# Patient Record
Sex: Female | Born: 1995 | Hispanic: Refuse to answer | Marital: Single | Smoking: Never smoker
Health system: Southern US, Community
[De-identification: ages and names within clinical notes are randomized; demographics above are authoritative.]

---
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
## 2017-07-22 ENCOUNTER — Encounter: Payer: Self-pay | Admitting: Family Medicine

## 2017-07-22 ENCOUNTER — Ambulatory Visit (INDEPENDENT_AMBULATORY_CARE_PROVIDER_SITE_OTHER): Payer: BLUE CROSS/BLUE SHIELD | Admitting: Family Medicine

## 2017-07-22 VITALS — BP 132/88 | HR 83 | Temp 98.7°F | Resp 14

## 2017-07-22 DIAGNOSIS — R197 Diarrhea, unspecified: Secondary | ICD-10-CM

## 2017-07-22 NOTE — Progress Notes (Signed)
Patient states that she went to [place] at the beginning of January. At the start of her trip she did have vomiting and diarrhea. This lasted for 1-2 days. She admits that after that she had no problems on her trip. There were other people on the trip that did get sick. When patient returned in late January she had a week of symptoms of diarrhea. She had no vomiting. Her symptoms did improve and then she got constipated. She also admits that she got a hemorrhoid which has now improved. The constipation then resolved after a few days. She now is asymptomatic. She has no abdominal pain, fever, myalgias, headache, nausea, vomiting, diarrhea, urinary symptoms. She still however is worried about possibly having a parasite as other people on the trip did.  ROS: Negative except mentioned above. Vitals as per Epic.  GENERAL: NAD HEENT: no pharyngeal erythema, no exudate, no erythema of TMs, no cervical LAD RESP: CTA B CARD: RRR ABD: Positive bowel sounds, nontender, *does not want to show me a hemorrhoid NEURO: CN II-XII grossly intact   A/P: Diarrhea -given her recent travel and symptoms well do stool studies. Patient is to seek medical attention if any symptoms start again. Should follow up with me as well if the hemorrhoid persists or worsens. Discussed avoiding constipation with high fiber diet and increased water. Avoid lifting in the weight room for now until hemorrhoid has resolved.

## 2017-07-23 ENCOUNTER — Other Ambulatory Visit: Payer: Self-pay | Admitting: Family Medicine

## 2017-07-28 LAB — CLOSTRIDIUM DIFFICILE EIA: C DIFFICILE TOXINS A+ B, EIA: NEGATIVE

## 2017-07-28 LAB — STOOL CULTURE: E COLI SHIGA TOXIN ASSAY: NEGATIVE

## 2017-07-28 LAB — OVA AND PARASITE EXAMINATION

## 2017-08-16 ENCOUNTER — Encounter: Payer: Self-pay | Admitting: Family Medicine

## 2017-08-16 ENCOUNTER — Ambulatory Visit (INDEPENDENT_AMBULATORY_CARE_PROVIDER_SITE_OTHER): Payer: BLUE CROSS/BLUE SHIELD | Admitting: Family Medicine

## 2017-08-16 VITALS — BP 124/73 | HR 55 | Temp 99.0°F | Resp 14

## 2017-08-16 DIAGNOSIS — R05 Cough: Secondary | ICD-10-CM

## 2017-08-16 DIAGNOSIS — J029 Acute pharyngitis, unspecified: Secondary | ICD-10-CM

## 2017-08-16 DIAGNOSIS — R059 Cough, unspecified: Secondary | ICD-10-CM

## 2017-08-16 LAB — POCT INFLUENZA A/B
INFLUENZA A, POC: NEGATIVE
INFLUENZA B, POC: NEGATIVE

## 2017-08-16 LAB — POCT RAPID STREP A (OFFICE): RAPID STREP A SCREEN: NEGATIVE

## 2017-08-16 NOTE — Progress Notes (Signed)
Patient presents today with symptoms of mild cough, sore throat, night sweats, headache for the last 2 days. She denies any muscle aches, chest pain, shortness of breath, severe headache, diarrhea, vomiting. She admits to some fatigue over the last 2 days. She has not taken any medications today. Patient states her last menstrual period was a few days ago.  ROS: Negative except mentioned above.  GENERAL: NAD HEENT: mild pharyngeal erythema, no exudate, no erythema of TMs, no cervical LAD RESP: CTA B CARD: RRR NEURO: CN II-XII grossly intact   A/P: URI - likely viral, flu screen and rapid strep test were negative in the office, will do CBC and mono spot test, rest, hydration, Tylenol/Ibuprofen when necessary, Delsym when necessary, Claritin when necessary, no athletic activity her class if febrile, will discuss any further treatment plan once lab results have been reviewed.

## 2017-08-17 LAB — CBC WITH DIFFERENTIAL/PLATELET
Basophils Absolute: 0 10*3/uL (ref 0.0–0.2)
Basos: 0 %
EOS (ABSOLUTE): 0.1 10*3/uL (ref 0.0–0.4)
EOS: 1 %
HEMATOCRIT: 46.8 % — AB (ref 34.0–46.6)
Hemoglobin: 16 g/dL — ABNORMAL HIGH (ref 11.1–15.9)
Immature Grans (Abs): 0 10*3/uL (ref 0.0–0.1)
Immature Granulocytes: 0 %
LYMPHS ABS: 1.6 10*3/uL (ref 0.7–3.1)
Lymphs: 17 %
MCH: 31.7 pg (ref 26.6–33.0)
MCHC: 34.2 g/dL (ref 31.5–35.7)
MCV: 93 fL (ref 79–97)
MONOS ABS: 0.7 10*3/uL (ref 0.1–0.9)
Monocytes: 8 %
Neutrophils Absolute: 7 10*3/uL (ref 1.4–7.0)
Neutrophils: 74 %
Platelets: 263 10*3/uL (ref 150–379)
RBC: 5.04 x10E6/uL (ref 3.77–5.28)
RDW: 12.6 % (ref 12.3–15.4)
WBC: 9.4 10*3/uL (ref 3.4–10.8)

## 2017-08-17 LAB — MONONUCLEOSIS SCREEN: Mono Screen: NEGATIVE

## 2018-02-06 ENCOUNTER — Emergency Department
Admission: EM | Admit: 2018-02-06 | Discharge: 2018-02-06 | Disposition: A | Discharge status: Home / Self Care | Payer: BLUE CROSS/BLUE SHIELD | Attending: Emergency Medicine | Admitting: Emergency Medicine

## 2018-02-06 ENCOUNTER — Other Ambulatory Visit: Payer: Self-pay

## 2018-02-06 DIAGNOSIS — Z79899 Other long term (current) drug therapy: Secondary | ICD-10-CM | POA: Diagnosis not present

## 2018-02-06 DIAGNOSIS — R112 Nausea with vomiting, unspecified: Secondary | ICD-10-CM | POA: Insufficient documentation

## 2018-02-06 DIAGNOSIS — R531 Weakness: Secondary | ICD-10-CM | POA: Diagnosis not present

## 2018-02-06 DIAGNOSIS — R197 Diarrhea, unspecified: Secondary | ICD-10-CM | POA: Diagnosis not present

## 2018-02-06 MED ORDER — ONDANSETRON HCL 4 MG/2ML IJ SOLN: 4.0000 mg | Freq: Once | INTRAMUSCULAR | Status: DC | PRN

## 2018-02-06 NOTE — ED Provider Notes (Signed)
[facility] Emergency Department Provider Note   ____________________________________________   First MD Initiated Contact with Patient 02/06/18 [id number]     (approximate)  I have reviewed the triage vital signs and the nursing notes.   HISTORY  Chief Complaint Emesis and Diarrhea   HPI Cheryl Mcclain is a 22 y.o. female who complains of nausea vomiting diarrhea.  Last vomiting was at midnight in the last diarrhea was at 2 AM.  Patient is tolerating p.o. fluids well.  She still feels kind of weak.  I encouraged her to continue drinking more p.o. fluids and see if that will work.  She is not running a fever and otherwise feels well.  He has no abdominal pain or cramps.   History reviewed. No pertinent past medical history.  There are no active problems to display for this patient.   History reviewed. No pertinent surgical history.  Prior to Admission medications   Medication Sig Start Date End Date Taking? Authorizing Provider  norethindrone-ethinyl estradiol-iron (ESTROSTEP FE,TILIA FE,TRI-LEGEST FE) 1-20/1-30/1-35 MG-MCG tablet Take 1 tablet by mouth daily.   Yes [provider]    Allergies Patient has no known allergies.  No family history on file.  Social History Social History   Tobacco Use  . Smoking status: Never Smoker  . Smokeless tobacco: Never Used  Substance Use Topics  . Alcohol use: Yes  . Drug use: Not on file    Review of Systems  Constitutional: No fever/chills Eyes: No visual changes. ENT: No sore throat. Cardiovascular: Denies chest pain. Respiratory: Denies shortness of breath. Gastrointestinal: See HPI Genitourinary: Negative for dysuria. Musculoskeletal: Negative for back pain. Skin: Negative for rash. Neurological: Negative for headaches, focal weakness  ____________________________________________   PHYSICAL EXAM:  VITAL SIGNS: ED Triage Vitals  Enc Vitals Group     BP 02/06/18 0922 122/88    Pulse Rate 02/06/18 0922 93     Resp 02/06/18 0922 14     Temp 02/06/18 0922 98.3 F (36.8 C)     Temp Source 02/06/18 0922 Oral     SpO2 02/06/18 0922 99 %     Weight 02/06/18 0913 140 lb (63.5 kg)     Height 02/06/18 0913 5\' 4"  (1.626 m)     Head Circumference --      Peak Flow --      Pain Score 02/06/18 0913 0     Pain Loc --      Pain Edu? --      Excl. in GC? --    Constitutional: Alert and oriented. Well appearing and in no acute distress. Eyes: Conjunctivae are normal. Head: Atraumatic. Nose: No congestion/rhinnorhea. Mouth/Throat: Mucous membranes are moist.  Oropharynx non-erythematous. Neck: No stridor.  Cardiovascular: Normal rate, regular rhythm. Grossly normal heart sounds.  Good peripheral circulation. Respiratory: Normal respiratory effort.  No retractions. Lungs CTAB. Gastrointestinal: Soft and nontender. No distention. No abdominal bruits. No CVA tenderness. Musculoskeletal: No lower extremity tenderness nor edema.  No joint effusions. Neurologic:  Normal speech and language. No gross focal neurologic deficits are appreciated. No gait instability. Skin:  Skin is warm, dry and intact. No rash noted. Psychiatric: Mood and affect are normal. Speech and behavior are normal.  ____________________________________________   LABS (all labs ordered are listed, but only abnormal results are displayed)  Labs Reviewed - No data to display ____________________________________________  EKG   ____________________________________________  RADIOLOGY  ED MD interpretation:   Official radiology report(s): No results found.  ____________________________________________  PROCEDURES  Procedure(s) performed:   Procedures  Critical Care performed:   ____________________________________________   INITIAL IMPRESSION / ASSESSMENT AND PLAN / ED COURSE  Patient does well.  She leaves as planned but without discharge instructions as I was in the middle of the  code when she felt better and could not wait.         ____________________________________________   FINAL CLINICAL IMPRESSION(S) / ED DIAGNOSES  Final diagnoses:  Nausea vomiting and diarrhea     ED Discharge Orders    None       Note:  This document was prepared using Dragon voice recognition software and may include unintentional dictation errors.    Arnaldo Natal, MD 02/06/18 1248

## 2018-02-06 NOTE — ED Notes (Signed)
Patient tolerating liquids and crackers well

## 2018-02-06 NOTE — ED Notes (Signed)
Dr Darnelle Catalan states  He told patient the verbal instructions  pt left without  Paper instructions

## 2018-02-06 NOTE — ED Notes (Signed)
Patient  Not in exam room  Gown on stretcher Dr Darnelle Catalan

## 2018-02-06 NOTE — ED Triage Notes (Signed)
Pt c/o N/V/D since yesterday afternoon, after eating [employer]. Pt states "I think I have food poisoning" . Pt is not actively vomiting in triage.

## 2018-02-14 ENCOUNTER — Other Ambulatory Visit: Payer: Self-pay | Admitting: Family Medicine

## 2018-02-14 DIAGNOSIS — M545 Low back pain, unspecified: Secondary | ICD-10-CM

## 2018-02-15 ENCOUNTER — Ambulatory Visit
Admission: RE | Admit: 2018-02-15 | Discharge: 2018-02-15 | Disposition: A | Discharge status: Home / Self Care | Payer: BLUE CROSS/BLUE SHIELD | Source: Ambulatory Visit | Attending: Family Medicine | Admitting: Family Medicine

## 2018-02-15 DIAGNOSIS — M545 Low back pain, unspecified: Secondary | ICD-10-CM

## 2018-02-17 ENCOUNTER — Encounter: Payer: Self-pay | Admitting: Family Medicine

## 2018-02-17 ENCOUNTER — Ambulatory Visit (INDEPENDENT_AMBULATORY_CARE_PROVIDER_SITE_OTHER): Payer: PRIVATE HEALTH INSURANCE | Admitting: Family Medicine

## 2018-02-17 DIAGNOSIS — M545 Low back pain, unspecified: Secondary | ICD-10-CM

## 2018-02-17 MED ORDER — NAPROXEN 500 MG PO TABS: 500.0000 mg | ORAL_TABLET | Freq: Two times a day (BID) | ORAL | 0 refills | Status: AC

## 2018-02-22 ENCOUNTER — Ambulatory Visit
Admission: RE | Admit: 2018-02-22 | Discharge: 2018-02-22 | Disposition: A | Discharge status: Home / Self Care | Payer: BLUE CROSS/BLUE SHIELD | Source: Ambulatory Visit | Attending: Family Medicine | Admitting: Family Medicine

## 2018-02-22 DIAGNOSIS — M545 Low back pain, unspecified: Secondary | ICD-10-CM

## 2018-02-24 ENCOUNTER — Ambulatory Visit (INDEPENDENT_AMBULATORY_CARE_PROVIDER_SITE_OTHER): Payer: PRIVATE HEALTH INSURANCE | Admitting: Family Medicine

## 2018-02-24 DIAGNOSIS — M545 Low back pain, unspecified: Secondary | ICD-10-CM

## 2018-03-03 NOTE — Progress Notes (Signed)
Presents today with symptoms of right lower back pain.  She describes her pain initially starting off with hitting golf balls. She has been told that at the end of her swing she is going into more extension of her back on the right side than she needs to. She now is experiencing pain when she arches her back outside of playing golf.  Some discomfort with positions with sleeping.  She denies any fever, weight loss, incontinence, foot drop, radicular symptoms. Patient states that she had this pain over the summer and went to see an orthopedic physician in [place].  She was told that she had problems with her facet joint and got injections in the area that she was having pain.  She denies getting any imaging such as x-ray, CT, MRI at that time.  She does not feel that the injections helped much but the physician said that she may need further injections.  Patient has not been taking any medications on a consistent basis for her pain since the injections.  Patient denies any history of stress injury or vitamin D deficiency.  ROS: Negative except mentioned above. Vitals as per Epic.  GENERAL: NAD MSK: No midline tenderness, mild paravertebral tenderness on the right lower lumbar area, mild discomfort with extension and right side bending, negative straight leg raise, 5 out of 5 strength of lower extremities, toe and heel walk, N/V intact NEURO: CN II-XII grossly intact   A/P: Right lower back pain: x-rays were done earlier today which do not show any signs of spondy., given her symptoms and history would recommend further imaging to evaluate this with MRI, I have asked that she avoid any activities that cause her pain including hitting golf balls, would recommend that patient see PT and discussed with her coach about changing her form with hitting, can you with putting if no discomfort, NSAIDs prescribed, seek medical attention if any acute worsening symptoms, plan above was discussed with athletic  trainer.  Will have patient see Dr. Ardine Eng after MRI has been done.

## 2018-03-04 NOTE — Progress Notes (Signed)
Patient presents today to discuss recent MRI.  Patient states that her back symptoms have improved some since her last visit.  She no longer has symptoms with her daily activities however does feel some discomfort when she lays in a particular position when sleeping.  She is not played golf since we last met.  She is planning to see PT soon.  Denies any radicular symptoms, incontinence, fever, weight loss, foot drop.  ROS: Negative except mentioned above. Vitals as per Epic.  GENERAL: NAD RESP: CTA B CARD: RRR MSK: Back - No midline tenderness, mild right lower lumbar paravertebral tenderness, normal flexion, mild discomfort with extension and right side bending, mildly tight hamstrings, 5 out of 5 strength of lower extremities, negative straight leg raise, N/V intact NEURO: CN II-XII grossly intact   A/P: Right lower back pain -MRI does not show any evidence of spondy., would like patient to continue seeing PT and working on changing her form with hitting, he does not feel that she will be ready for the next 2 tournaments in the fall with golf, depending on how she feels and has practice she may be able to participate in the last tournament of the fall, can continue to putt if there is no discomfort and advance her hitting slowly if tolerated, it seems that her pain has improved since her last visit with rest, NSAIDs prn, will have Dr. Gerald Dexter review the imaging and see patient if needed, patient addresses understanding of plan and will seek medical attention if symptoms worsen.  Plan above was discussed with trainer as well.

## 2018-06-16 ENCOUNTER — Ambulatory Visit (INDEPENDENT_AMBULATORY_CARE_PROVIDER_SITE_OTHER): Payer: BLUE CROSS/BLUE SHIELD | Admitting: Family Medicine

## 2018-06-16 ENCOUNTER — Encounter: Payer: Self-pay | Admitting: Family Medicine

## 2018-06-16 VITALS — BP 116/69 | HR 55 | Temp 98.3°F | Resp 14 | Wt 137.3 lb

## 2018-06-16 DIAGNOSIS — K5909 Other constipation: Secondary | ICD-10-CM

## 2018-06-17 NOTE — Progress Notes (Signed)
Patient presents today with complaints of constipation.  Patient states that she recently returned from [place] and has not had a bowel movement for 4 to 5 days.  Patient has noticed some "white balls" when having a bowel movement.  Patient admits that she has had external hemorrhoids in the past as well and rectal bleeding as a result of them.  Currently she has no bleeding.  She has not tried any medications for her constipation recently.  She does admit that her diet while she was in consisted of bananas, rice, bread, vegetables.  She admits to always drinking bottled water. Patient also admits that since February 2019 when she went she returned from a trip to [place] her bowel habits have just not been normal.  At times she will have diarrhea and then constipation.  She denies having food allergies.  She is unsure of any particular foods causing her symptoms.  She denies any chronic weight loss or weight gain.   ROS: Negative except mentioned above. Vitals as per Epic.  GENERAL: NAD HEENT: no pharyngeal erythema, no exudate, no erythema of TMs, no cervical LAD RESP: CTA B CARD: RRR ABD: +BS, NT, no rebound or guarding appreciated, no organomegly appreciated  NEURO: CN II-XII grossly intact   A/P: Constipation - the foods that she was primarily eating on her trip would likely cause constipation, suggested trying magnesium citrate once or twice only, recommend increased fiber and stool softner daily, given however her unusual stool and history of chronic constipation/diarrhea would recommend that she have a GI evaluation, a referral has been made.  Patient is to seek immediate medical attention if any acute problems arise.

## 2018-06-23 ENCOUNTER — Encounter: Payer: Self-pay | Admitting: *Deleted

## 2019-02-22 ENCOUNTER — Other Ambulatory Visit: Payer: Self-pay

## 2019-02-22 DIAGNOSIS — Z20822 Contact with and (suspected) exposure to covid-19: Secondary | ICD-10-CM

## 2019-02-23 LAB — NOVEL CORONAVIRUS, NAA: SARS-CoV-2, NAA: DETECTED — AB

## 2019-03-06 ENCOUNTER — Other Ambulatory Visit: Payer: Self-pay | Admitting: Family Medicine

## 2019-03-06 DIAGNOSIS — I400 Infective myocarditis: Secondary | ICD-10-CM

## 2019-03-06 DIAGNOSIS — U071 COVID-19: Secondary | ICD-10-CM

## 2019-03-06 DIAGNOSIS — Z8616 Personal history of COVID-19: Secondary | ICD-10-CM

## 2019-03-06 DIAGNOSIS — Z8619 Personal history of other infectious and parasitic diseases: Secondary | ICD-10-CM

## 2019-03-08 ENCOUNTER — Other Ambulatory Visit: Payer: Self-pay

## 2019-03-08 ENCOUNTER — Other Ambulatory Visit: Payer: BLUE CROSS/BLUE SHIELD | Admitting: Family Medicine

## 2019-03-08 DIAGNOSIS — Z0000 Encounter for general adult medical examination without abnormal findings: Secondary | ICD-10-CM

## 2019-03-09 LAB — TROPONIN I: Troponin I: <0.01 ng/mL (ref 0.00–0.04)

## 2019-03-23 ENCOUNTER — Ambulatory Visit (INDEPENDENT_AMBULATORY_CARE_PROVIDER_SITE_OTHER): Payer: BLUE CROSS/BLUE SHIELD

## 2019-03-23 ENCOUNTER — Other Ambulatory Visit: Payer: Self-pay

## 2019-03-23 DIAGNOSIS — I400 Infective myocarditis: Secondary | ICD-10-CM

## 2019-03-23 DIAGNOSIS — U071 COVID-19: Secondary | ICD-10-CM | POA: Diagnosis not present

## 2019-03-23 DIAGNOSIS — Z8619 Personal history of other infectious and parasitic diseases: Secondary | ICD-10-CM | POA: Diagnosis not present

## 2019-03-23 DIAGNOSIS — Z8616 Personal history of COVID-19: Secondary | ICD-10-CM

## 2020-05-17 IMAGING — CR DG LUMBAR SPINE COMPLETE 4+V
1 series · 6 of 6 positions shown · non-contrast
Comparison: None.

CLINICAL DATA: Chronic low back pain worsening over the past year.

EXAM:
LUMBAR SPINE - COMPLETE 4+ VIEW

[Series 1: dg lumbar spine complete 4 +v · 0.14mm/px · 6 of 6 slices shown]
[im 1/6]
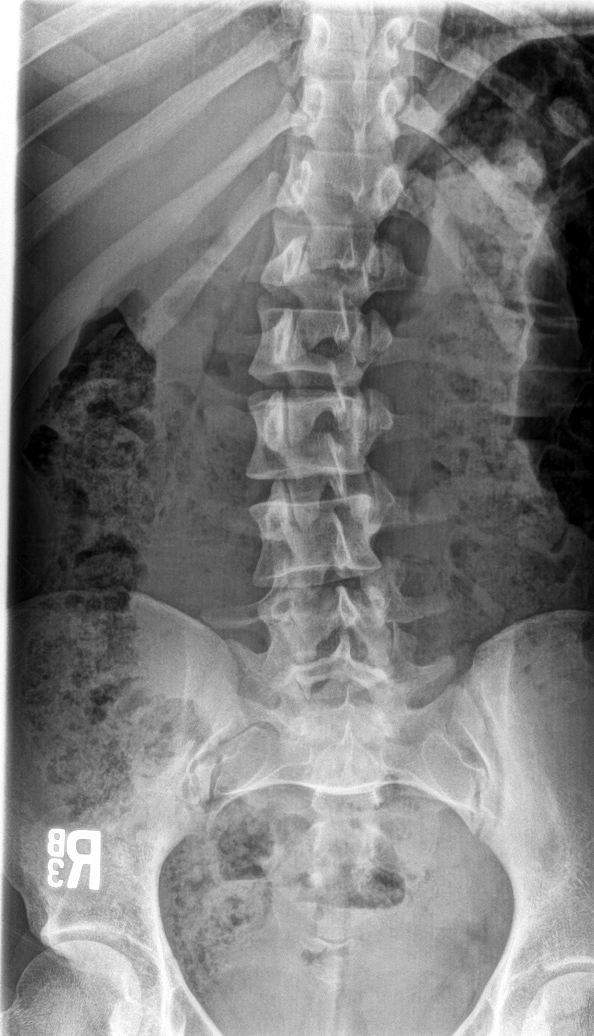
[im 2/6]
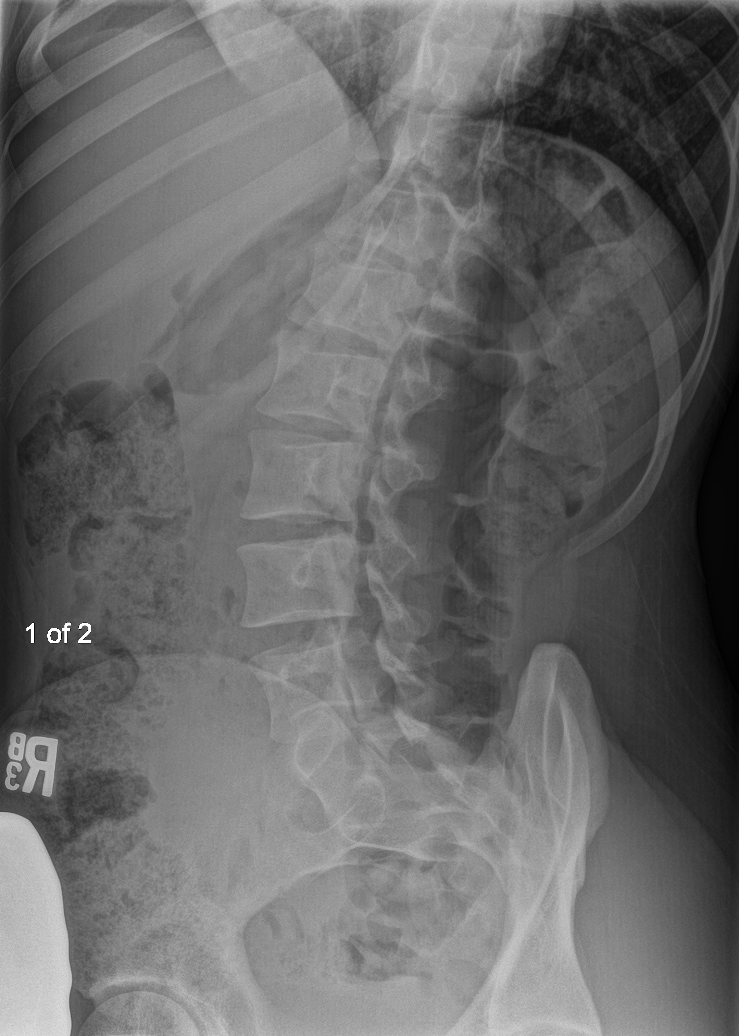
[im 3/6]
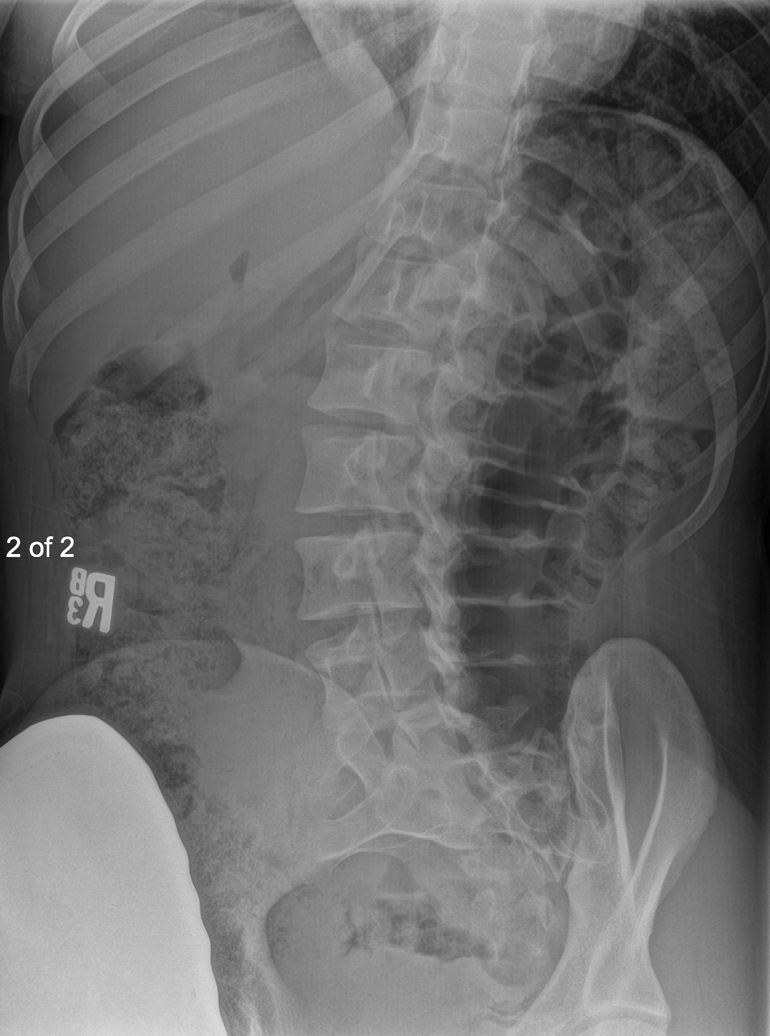
[im 4/6]
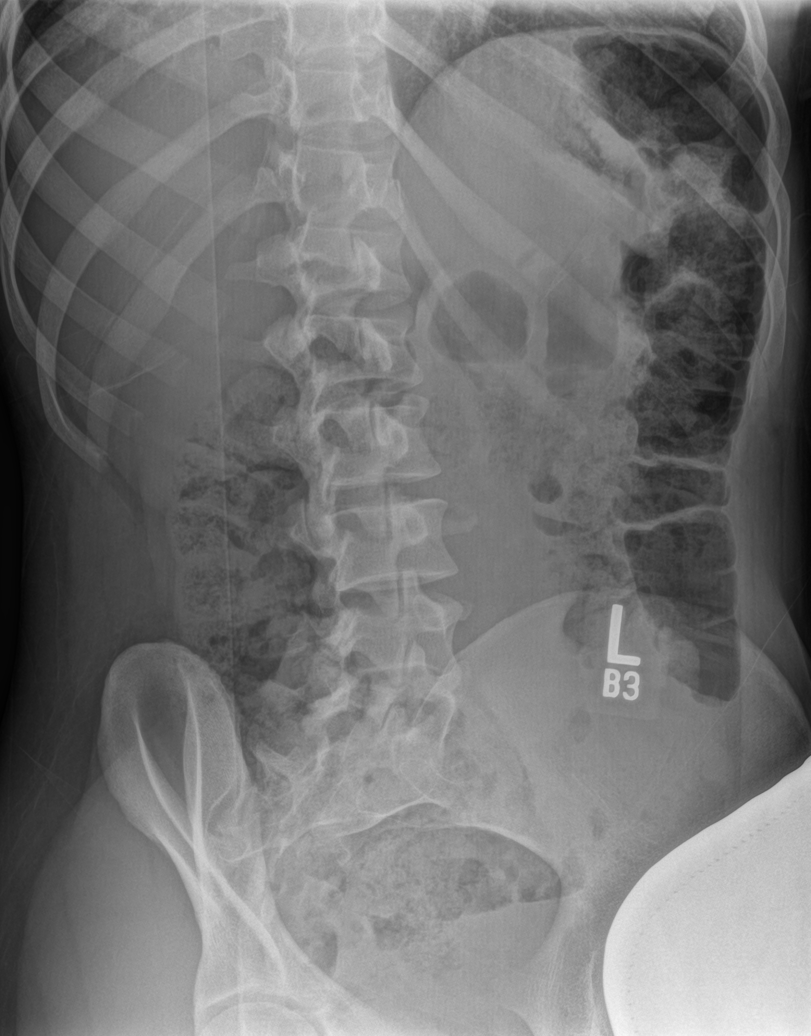
[im 5/6]
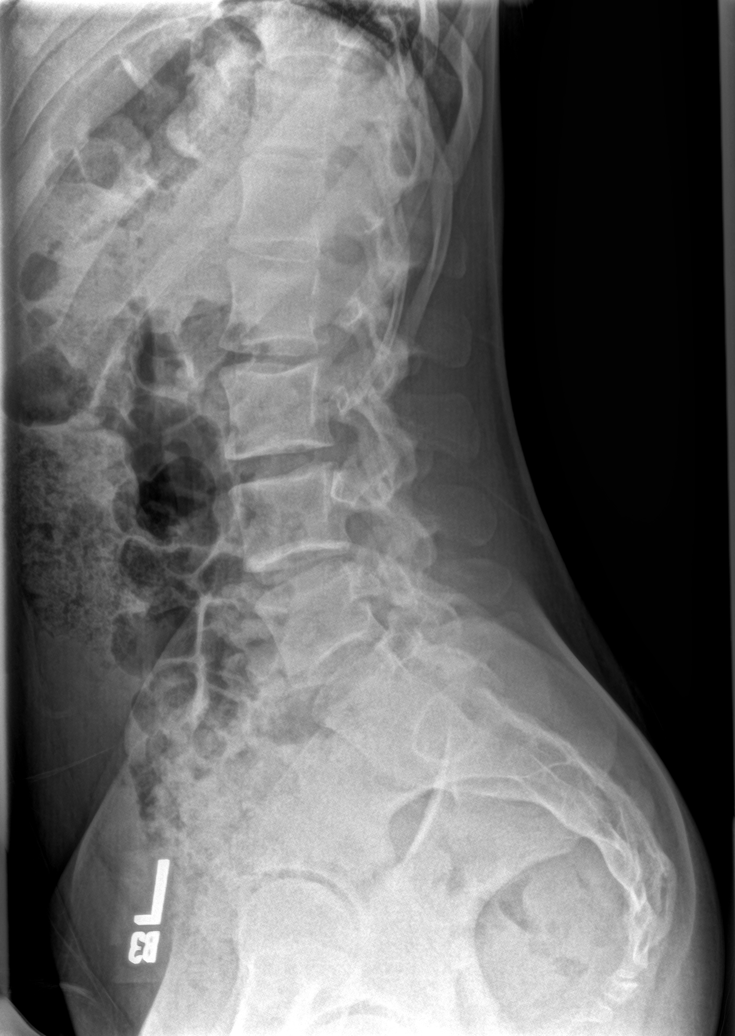
[im 6/6]
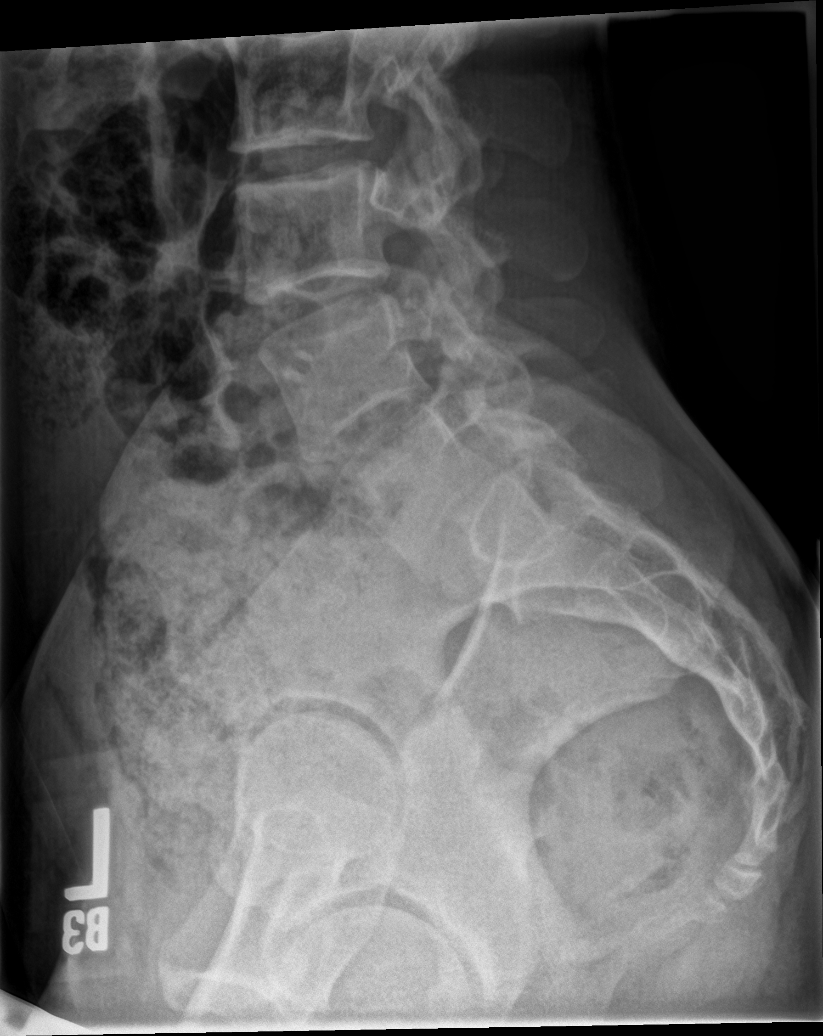

[6 of 6 positions shown; findings below may reference images not displayed]

FINDINGS: Transitional lumbosacral anatomy with lumbarization of S1. No acute
fracture or subluxation. Vertebral body heights are preserved.
Slight dextrocurvature of the lumbar spine. Alignment is normal.
Intervertebral disc spaces are maintained. No pars defects. The
sacroiliac joints are unremarkable.
IMPRESSION: 1. No acute osseous abnormality or significant degenerative changes.
No pars defects.
2. Lumbarization of S1.
